# Patient Record
Sex: Male | Born: 1970 | Race: Black or African American | Hispanic: No | Marital: Married | Smoking: Former smoker
Health system: Southern US, Community
[De-identification: ages and names within clinical notes are randomized; demographics above are authoritative.]

---

## 2012-02-24 ENCOUNTER — Ambulatory Visit
Admission: RE | Admit: 2012-02-24 | Discharge: 2012-02-24 | Disposition: A | Discharge status: Home / Self Care | Payer: No Typology Code available for payment source | Source: Ambulatory Visit | Attending: Infectious Diseases | Admitting: Infectious Diseases

## 2012-02-24 ENCOUNTER — Other Ambulatory Visit: Payer: Self-pay | Admitting: Infectious Diseases

## 2012-02-24 DIAGNOSIS — R7611 Nonspecific reaction to tuberculin skin test without active tuberculosis: Secondary | ICD-10-CM

## 2013-08-20 IMAGING — CR DG CHEST 1V
1 series · 1 of 1 positions shown · non-contrast
Comparison: None.

CLINICAL DATA: Positive TB test

CHEST - 1 VIEW

[view not recorded]
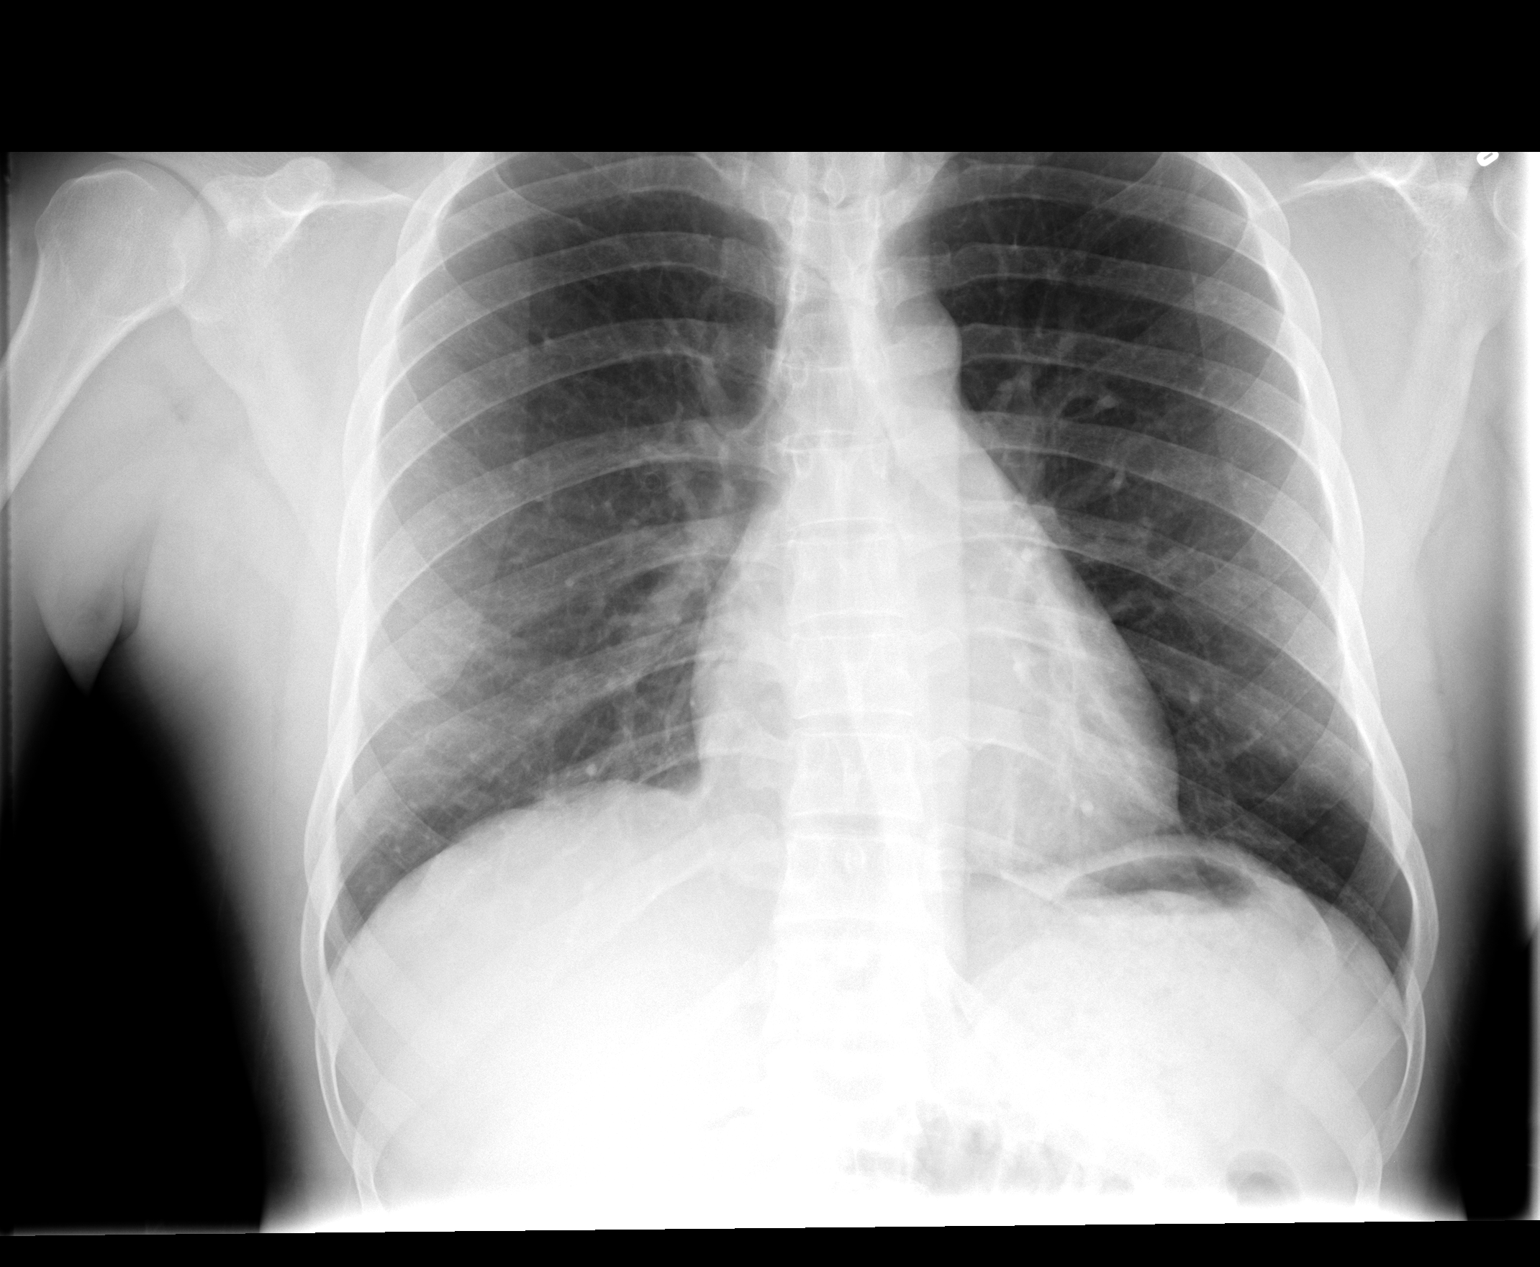

[1 of 1 positions shown; findings below may reference images not displayed]

FINDINGS: No active infiltrate or effusion is seen.  No sequela of
prior tuberculous infection is seen.  Mediastinal contours appear
normal.  The heart is within normal limits in size.  No bony
abnormality is seen.
IMPRESSION: No active lung disease.

## 2016-11-09 ENCOUNTER — Encounter (HOSPITAL_COMMUNITY): Payer: Self-pay | Admitting: *Deleted

## 2016-11-09 ENCOUNTER — Ambulatory Visit (HOSPITAL_COMMUNITY)
Admission: EM | Admit: 2016-11-09 | Discharge: 2016-11-09 | Disposition: A | Discharge status: Home / Self Care | Payer: PRIVATE HEALTH INSURANCE | Attending: Emergency Medicine | Admitting: Emergency Medicine

## 2016-11-09 DIAGNOSIS — B09 Unspecified viral infection characterized by skin and mucous membrane lesions: Secondary | ICD-10-CM

## 2016-11-09 DIAGNOSIS — L259 Unspecified contact dermatitis, unspecified cause: Secondary | ICD-10-CM

## 2016-11-09 MED ORDER — TRIAMCINOLONE ACETONIDE 0.1 % EX CREA: TOPICAL_CREAM | CUTANEOUS | 0 refills | Status: AC

## 2016-11-09 MED ORDER — PENCICLOVIR 1 % EX CREA
1.0000 | TOPICAL_CREAM | CUTANEOUS | 0 refills | Status: AC
Start: 2016-11-09 — End: ?

## 2016-11-09 NOTE — ED Provider Notes (Signed)
CSN: [id number]     Arrival date & time 11/09/16  1445 History   None    No chief complaint on file.  (Consider location/radiation/quality/duration/timing/severity/associated sxs/prior Treatment) 46 year old [place] that speaks Arabic but also some [place] and is accompanied by an interpreter presents with a problem with his lower lip. He states he has had episodic lip lesions particular to the mucous membrane aspect of the lower lip off and on for 20 years. Nothing new this particular visit. There are discolorations including small areas of erythema to the lower lip, mucous membrane area. Not necessarily to the vermilion. Sometimes it is sore. No current evidence of bacterial infection. No current bleeding or drainage. No swelling.      History reviewed. No pertinent past medical history. History reviewed. No pertinent surgical history. History reviewed. No pertinent family history. Social History  Substance Use Topics  . Smoking status: Former Games developermoker  . Smokeless tobacco: Never Used  . Alcohol use No    Review of Systems  Constitutional: Negative.   HENT: Negative for congestion and sore throat.   Respiratory: Negative.   Skin:       Rough pruritic rash to the right side of the neck. Mildly erythematous.  Neurological: Negative.   All other systems reviewed and are negative.   Allergies  Patient has no known allergies.  Home Medications   Prior to Admission medications   Medication Sig Start Date End Date Taking? Authorizing Provider  penciclovir (DENAVIR) 1 % cream Apply 1 application topically every 2 (two) hours. 11/09/16   Hayden Rasmussenavid Hilarie Sinha, NP   Meds Ordered and Administered this Visit  Medications - No data to display  BP 111/78 (BP Location: Right Arm)   Pulse 78   Temp 98.2 F (36.8 C) (Oral)   Resp 18   SpO2 99%  No data found.   Physical Exam  Constitutional: He is oriented to person, place, and time. He appears well-developed and  well-nourished.  HENT:  See history of present illness for description of lip lesions. These are primarily macular lesions of discoloration involving the mucotaneous aspect of the lower lip.  Eyes: EOM are normal.  Neck: Normal range of motion. Neck supple.  Cardiovascular: Normal rate.   Pulmonary/Chest: Effort normal.  Neurological: He is alert and oriented to person, place, and time.  Skin: Skin is warm. Rash noted.  Rough, pruritic rash to the right side of the right neck consistent with a contact dermatitis.  Nursing note and vitals reviewed.   Urgent Care Course     Procedures (including critical care time)  Labs Review Labs Reviewed - No data to display  Imaging Review No results found.   Visual Acuity Review  Right Eye Distance:   Left Eye Distance:   Bilateral Distance:    Right Eye Near:   Left Eye Near:    Bilateral Near:         MDM   1. Viral enanthem of mouth    It is likely that the problem on your lip is a virus. Apply the cream every 2 hours while awake for the next 3 days. He will also need to obtain a primary care provider as soon as possible. Meds ordered this encounter  Medications  . penciclovir (DENAVIR) 1 % cream    Sig: Apply 1 application topically every 2 (two) hours.    Dispense:  1.5 g    Refill:  0    Order Specific Question:  Supervising Provider    Answer:   Charm Rings [[id number]]   Triamcinolone cream 0.1% bid to right neck rash    Hayden Rasmussen, NP 11/09/16 1646

## 2016-11-09 NOTE — ED Notes (Signed)
Pt    Needs   Arabic  interpretor

## 2016-11-09 NOTE — Discharge Instructions (Signed)
It is likely that the problem on your lip is a virus. Apply the cream every 2 hours while awake for the next 3 days. He will also need to obtain a primary care provider as soon as possible.

## 2018-04-12 ENCOUNTER — Emergency Department (HOSPITAL_COMMUNITY)
Admission: EM | Admit: 2018-04-12 | Discharge: 2018-04-12 | Disposition: A | Discharge status: Home / Self Care | Payer: Medicaid Other | Attending: Emergency Medicine | Admitting: Emergency Medicine

## 2018-04-12 ENCOUNTER — Encounter (HOSPITAL_COMMUNITY): Payer: Self-pay

## 2018-04-12 ENCOUNTER — Other Ambulatory Visit: Payer: Self-pay

## 2018-04-12 DIAGNOSIS — F17220 Nicotine dependence, chewing tobacco, uncomplicated: Secondary | ICD-10-CM | POA: Insufficient documentation

## 2018-04-12 DIAGNOSIS — Z79899 Other long term (current) drug therapy: Secondary | ICD-10-CM | POA: Insufficient documentation

## 2018-04-12 DIAGNOSIS — K0889 Other specified disorders of teeth and supporting structures: Secondary | ICD-10-CM | POA: Insufficient documentation

## 2018-04-12 MED ORDER — NAPROXEN 500 MG PO TABS: 500.0000 mg | ORAL_TABLET | Freq: Two times a day (BID) | ORAL | 0 refills | Status: AC

## 2018-04-12 MED ORDER — PENICILLIN V POTASSIUM 500 MG PO TABS: 500.0000 mg | ORAL_TABLET | Freq: Four times a day (QID) | ORAL | 0 refills | Status: AC

## 2018-04-12 MED ORDER — OXYCODONE-ACETAMINOPHEN 5-325 MG PO TABS: 1.0000 | ORAL_TABLET | Freq: Three times a day (TID) | ORAL | 0 refills | Status: AC | PRN

## 2018-04-12 NOTE — ED Triage Notes (Signed)
Pt c/o dental pain X10 days. With worsening pain over the past 2-3 days. Pt has apt to have tooth pulled Aug 2nd.

## 2018-04-12 NOTE — ED Provider Notes (Signed)
[facility] EMERGENCY DEPARTMENT Provider Note   CSN: [id number] Arrival date & time: 04/12/18  1533     History   Chief Complaint Chief Complaint  Patient presents with  . Dental Pain    HPI Tristan Torres is a 47 y.o. male who presents to ED for evaluation of several month history of left lower dental pain that has worsened in the past 3 days.  He has tried ibuprofen with no improvement in his symptoms.  He tried to schedule an appointment with his dentist but he is not available until August.  States that he has had pain intermittently in the tooth but this is more severe.  States that his pain radiates to the out the entire left side of his head.  Denies any trouble breathing or trouble swallowing, facial swelling, drooling, trouble opening the mouth, drainage or bleeding from the area, fever, sore throat.  HPI  History reviewed. No pertinent past medical history.  There are no active problems to display for this patient.   History reviewed. No pertinent surgical history.      Home Medications    Prior to Admission medications   Medication Sig Start Date End Date Taking? Authorizing Provider  naproxen (NAPROSYN) 500 MG tablet Take 1 tablet (500 mg total) by mouth 2 (two) times daily. 04/12/18   Derrian Poli, PA-C  oxyCODONE-acetaminophen (PERCOCET/ROXICET) 5-325 MG tablet Take 1 tablet by mouth every 8 (eight) hours as needed for severe pain. 04/12/18   Ameira Alessandrini, PA-C  penciclovir (DENAVIR) 1 % cream Apply 1 application topically every 2 (two) hours. 11/09/16   Hayden Rasmussen, NP  penicillin v potassium (VEETID) 500 MG tablet Take 1 tablet (500 mg total) by mouth 4 (four) times daily for 7 days. 04/12/18 04/19/18  Dietrich Pates, PA-C  triamcinolone cream (KENALOG) 0.1 % Apply to affected area of the neck bid 11/09/16   Hayden Rasmussen, NP    Family History No family history on file.  Social History Social History   Tobacco Use  . Smoking status: Former  Games developer  . Smokeless tobacco: Current User  Substance Use Topics  . Alcohol use: No  . Drug use: Never     Allergies   Patient has no known allergies.   Review of Systems Review of Systems  Constitutional: Negative for chills and fever.  HENT: Positive for dental problem. Negative for drooling, facial swelling, sore throat and trouble swallowing.   Respiratory: Negative for shortness of breath and stridor.   Musculoskeletal: Negative for neck pain and neck stiffness.     Physical Exam Updated Vital Signs BP (!) 135/93 (BP Location: Right Arm)   Pulse 76   Temp 99 F (37.2 C) (Oral)   Resp 18   Ht 5\' 7"  (1.702 m)   Wt 81 kg (178 lb 9.2 oz)   SpO2 100%   BMI 27.97 kg/m   Physical Exam  Constitutional: He appears well-developed and well-nourished. No distress.  Nontoxic-appearing and in no acute distress.  HENT:  Head: Normocephalic and atraumatic.  Mouth/Throat: Oropharynx is clear and moist and mucous membranes are normal. He does not have dentures. No oral lesions. No trismus in the jaw. Abnormal dentition. Dental caries present. No dental abscesses, uvula swelling or lacerations.    There is overall poor dentition with several missing and decaying teeth.  Several dental caries noted.  There is a dental carry and the indicated tooth with tenderness to palpation.  No surrounding gum induration or  fluctuance.  No gross dental abscess noted. No facial, neck or cheek swelling noted. No pooling of secretions or trismus.  Normal voice noted with no difficulty swallowing or breathing.  No submandibular erythema, edema or crepitus noted.  Eyes: Conjunctivae and EOM are normal. No scleral icterus.  Neck: Normal range of motion.  Pulmonary/Chest: Effort normal. No respiratory distress.  Neurological: He is alert.  Skin: No rash noted. He is not diaphoretic.  Psychiatric: He has a normal mood and affect.  Nursing note and vitals reviewed.    ED Treatments / Results   Labs (all labs ordered are listed, but only abnormal results are displayed) Labs Reviewed - No data to display  EKG None  Radiology No results found.  Procedures Procedures (including critical care time)  Medications Ordered in ED Medications - No data to display   Initial Impression / Assessment and Plan / ED Course  I have reviewed the triage vital signs and the nursing notes.  Pertinent labs & imaging results that were available during my care of the patient were reviewed by me and considered in my medical decision making (see chart for details).     47 year old male presents for left lower dental pain that has been intermittent for several months but has worsened in the past 3 days.  Cannot recall any inciting event that have triggered the symptoms.  He is unable to schedule an appointment with his dentist until August due to the holiday.  No improvement with ibuprofen. On exam, there is no evidence of a drainable abscess. No trismus, glossal elevation, unilateral tonsillar swelling. No evidence of retropharyngeal or peritonsillar abscess or Ludwig angina. Will treat with penicillin, short course of pain medication and anti-inflammatories. Pt instructed to follow-up with dentist as soon as possible.  [place] PMP reviewed with no discrepancies.  Portions of this note were generated with Scientist, clinical (histocompatibility and immunogenetics)Dragon dictation software. Dictation errors may occur despite best attempts at proofreading.   Final Clinical Impressions(s) / ED Diagnoses   Final diagnoses:  Pain, dental    ED Discharge Orders        Ordered    penicillin v potassium (VEETID) 500 MG tablet  4 times daily     04/12/18 1638    oxyCODONE-acetaminophen (PERCOCET/ROXICET) 5-325 MG tablet  Every 8 hours PRN     04/12/18 1638    naproxen (NAPROSYN) 500 MG tablet  2 times daily     04/12/18 1638       Dietrich PatesKhatri, Khale Nigh, PA-C 04/12/18 1638    Margarita Grizzleay, Danielle, MD 04/12/18 1941

## 2018-04-12 NOTE — Discharge Instructions (Signed)
Please take the entire course of antibiotics regardless of symptom improvement. Return to ED for worsening symptoms, trouble breathing or trouble swallowing, chest pain, coughing up blood.

## 2020-04-11 DIAGNOSIS — Z419 Encounter for procedure for purposes other than remedying health state, unspecified: Secondary | ICD-10-CM | POA: Diagnosis not present

## 2020-05-12 DIAGNOSIS — Z419 Encounter for procedure for purposes other than remedying health state, unspecified: Secondary | ICD-10-CM | POA: Diagnosis not present

## 2020-06-12 DIAGNOSIS — Z419 Encounter for procedure for purposes other than remedying health state, unspecified: Secondary | ICD-10-CM | POA: Diagnosis not present

## 2020-07-12 DIAGNOSIS — Z419 Encounter for procedure for purposes other than remedying health state, unspecified: Secondary | ICD-10-CM | POA: Diagnosis not present

## 2020-08-12 DIAGNOSIS — Z419 Encounter for procedure for purposes other than remedying health state, unspecified: Secondary | ICD-10-CM | POA: Diagnosis not present

## 2020-09-11 DIAGNOSIS — Z419 Encounter for procedure for purposes other than remedying health state, unspecified: Secondary | ICD-10-CM | POA: Diagnosis not present

## 2020-10-12 DIAGNOSIS — Z419 Encounter for procedure for purposes other than remedying health state, unspecified: Secondary | ICD-10-CM | POA: Diagnosis not present

## 2020-11-12 DIAGNOSIS — Z419 Encounter for procedure for purposes other than remedying health state, unspecified: Secondary | ICD-10-CM | POA: Diagnosis not present

## 2020-12-10 DIAGNOSIS — Z419 Encounter for procedure for purposes other than remedying health state, unspecified: Secondary | ICD-10-CM | POA: Diagnosis not present

## 2021-01-10 DIAGNOSIS — Z419 Encounter for procedure for purposes other than remedying health state, unspecified: Secondary | ICD-10-CM | POA: Diagnosis not present

## 2021-02-09 DIAGNOSIS — Z419 Encounter for procedure for purposes other than remedying health state, unspecified: Secondary | ICD-10-CM | POA: Diagnosis not present

## 2021-03-12 DIAGNOSIS — Z419 Encounter for procedure for purposes other than remedying health state, unspecified: Secondary | ICD-10-CM | POA: Diagnosis not present

## 2021-04-11 DIAGNOSIS — Z419 Encounter for procedure for purposes other than remedying health state, unspecified: Secondary | ICD-10-CM | POA: Diagnosis not present

## 2021-05-12 DIAGNOSIS — Z419 Encounter for procedure for purposes other than remedying health state, unspecified: Secondary | ICD-10-CM | POA: Diagnosis not present

## 2021-06-12 DIAGNOSIS — Z419 Encounter for procedure for purposes other than remedying health state, unspecified: Secondary | ICD-10-CM | POA: Diagnosis not present

## 2021-07-12 DIAGNOSIS — Z419 Encounter for procedure for purposes other than remedying health state, unspecified: Secondary | ICD-10-CM | POA: Diagnosis not present

## 2021-08-12 DIAGNOSIS — Z419 Encounter for procedure for purposes other than remedying health state, unspecified: Secondary | ICD-10-CM | POA: Diagnosis not present

## 2021-09-11 DIAGNOSIS — Z419 Encounter for procedure for purposes other than remedying health state, unspecified: Secondary | ICD-10-CM | POA: Diagnosis not present

## 2021-10-12 DIAGNOSIS — Z419 Encounter for procedure for purposes other than remedying health state, unspecified: Secondary | ICD-10-CM | POA: Diagnosis not present

## 2021-11-12 DIAGNOSIS — Z419 Encounter for procedure for purposes other than remedying health state, unspecified: Secondary | ICD-10-CM | POA: Diagnosis not present

## 2021-12-10 DIAGNOSIS — Z419 Encounter for procedure for purposes other than remedying health state, unspecified: Secondary | ICD-10-CM | POA: Diagnosis not present

## 2022-01-10 DIAGNOSIS — Z419 Encounter for procedure for purposes other than remedying health state, unspecified: Secondary | ICD-10-CM | POA: Diagnosis not present

## 2022-02-09 DIAGNOSIS — Z419 Encounter for procedure for purposes other than remedying health state, unspecified: Secondary | ICD-10-CM | POA: Diagnosis not present

## 2022-03-12 DIAGNOSIS — Z419 Encounter for procedure for purposes other than remedying health state, unspecified: Secondary | ICD-10-CM | POA: Diagnosis not present

## 2022-04-11 DIAGNOSIS — Z419 Encounter for procedure for purposes other than remedying health state, unspecified: Secondary | ICD-10-CM | POA: Diagnosis not present

## 2022-05-12 DIAGNOSIS — Z419 Encounter for procedure for purposes other than remedying health state, unspecified: Secondary | ICD-10-CM | POA: Diagnosis not present

## 2022-06-12 DIAGNOSIS — Z419 Encounter for procedure for purposes other than remedying health state, unspecified: Secondary | ICD-10-CM | POA: Diagnosis not present

## 2022-07-12 DIAGNOSIS — Z419 Encounter for procedure for purposes other than remedying health state, unspecified: Secondary | ICD-10-CM | POA: Diagnosis not present

## 2022-08-12 DIAGNOSIS — Z419 Encounter for procedure for purposes other than remedying health state, unspecified: Secondary | ICD-10-CM | POA: Diagnosis not present

## 2022-09-11 DIAGNOSIS — Z419 Encounter for procedure for purposes other than remedying health state, unspecified: Secondary | ICD-10-CM | POA: Diagnosis not present

## 2022-10-12 DIAGNOSIS — Z419 Encounter for procedure for purposes other than remedying health state, unspecified: Secondary | ICD-10-CM | POA: Diagnosis not present

## 2022-11-12 DIAGNOSIS — Z419 Encounter for procedure for purposes other than remedying health state, unspecified: Secondary | ICD-10-CM | POA: Diagnosis not present

## 2022-12-11 DIAGNOSIS — Z419 Encounter for procedure for purposes other than remedying health state, unspecified: Secondary | ICD-10-CM | POA: Diagnosis not present

## 2023-01-11 DIAGNOSIS — Z419 Encounter for procedure for purposes other than remedying health state, unspecified: Secondary | ICD-10-CM | POA: Diagnosis not present

## 2023-02-10 DIAGNOSIS — Z419 Encounter for procedure for purposes other than remedying health state, unspecified: Secondary | ICD-10-CM | POA: Diagnosis not present

## 2023-03-13 DIAGNOSIS — Z419 Encounter for procedure for purposes other than remedying health state, unspecified: Secondary | ICD-10-CM | POA: Diagnosis not present

## 2023-04-12 DIAGNOSIS — Z419 Encounter for procedure for purposes other than remedying health state, unspecified: Secondary | ICD-10-CM | POA: Diagnosis not present

## 2023-05-13 DIAGNOSIS — Z419 Encounter for procedure for purposes other than remedying health state, unspecified: Secondary | ICD-10-CM | POA: Diagnosis not present

## 2023-06-13 DIAGNOSIS — Z419 Encounter for procedure for purposes other than remedying health state, unspecified: Secondary | ICD-10-CM | POA: Diagnosis not present

## 2023-07-13 DIAGNOSIS — Z419 Encounter for procedure for purposes other than remedying health state, unspecified: Secondary | ICD-10-CM | POA: Diagnosis not present

## 2023-08-13 DIAGNOSIS — Z419 Encounter for procedure for purposes other than remedying health state, unspecified: Secondary | ICD-10-CM | POA: Diagnosis not present

## 2023-09-12 DIAGNOSIS — Z419 Encounter for procedure for purposes other than remedying health state, unspecified: Secondary | ICD-10-CM | POA: Diagnosis not present

## 2023-10-13 DIAGNOSIS — Z419 Encounter for procedure for purposes other than remedying health state, unspecified: Secondary | ICD-10-CM | POA: Diagnosis not present

## 2023-11-13 DIAGNOSIS — Z419 Encounter for procedure for purposes other than remedying health state, unspecified: Secondary | ICD-10-CM | POA: Diagnosis not present

## 2023-12-11 DIAGNOSIS — Z419 Encounter for procedure for purposes other than remedying health state, unspecified: Secondary | ICD-10-CM | POA: Diagnosis not present

## 2024-01-22 DIAGNOSIS — Z419 Encounter for procedure for purposes other than remedying health state, unspecified: Secondary | ICD-10-CM | POA: Diagnosis not present

## 2024-02-21 DIAGNOSIS — Z419 Encounter for procedure for purposes other than remedying health state, unspecified: Secondary | ICD-10-CM | POA: Diagnosis not present

## 2024-03-23 DIAGNOSIS — Z419 Encounter for procedure for purposes other than remedying health state, unspecified: Secondary | ICD-10-CM | POA: Diagnosis not present

## 2024-04-22 DIAGNOSIS — Z419 Encounter for procedure for purposes other than remedying health state, unspecified: Secondary | ICD-10-CM | POA: Diagnosis not present

## 2024-05-23 DIAGNOSIS — Z419 Encounter for procedure for purposes other than remedying health state, unspecified: Secondary | ICD-10-CM | POA: Diagnosis not present

## 2024-06-23 DIAGNOSIS — Z419 Encounter for procedure for purposes other than remedying health state, unspecified: Secondary | ICD-10-CM | POA: Diagnosis not present

## 2024-09-22 DIAGNOSIS — Z419 Encounter for procedure for purposes other than remedying health state, unspecified: Secondary | ICD-10-CM | POA: Diagnosis not present
